# Patient Record
Sex: Male | Born: 1995 | Race: Black or African American | Hispanic: No | Marital: Single | Smoking: Never smoker
Health system: Southern US, Community
[De-identification: ages and names within clinical notes are randomized; demographics above are authoritative.]

---

## 2019-08-01 ENCOUNTER — Emergency Department (HOSPITAL_COMMUNITY)
Admission: EM | Admit: 2019-08-01 | Discharge: 2019-08-02 | Disposition: A | Discharge status: Home / Self Care | Payer: Medicaid Other | Attending: Emergency Medicine | Admitting: Emergency Medicine

## 2019-08-01 ENCOUNTER — Encounter (HOSPITAL_COMMUNITY): Payer: Self-pay

## 2019-08-01 DIAGNOSIS — Z23 Encounter for immunization: Secondary | ICD-10-CM | POA: Insufficient documentation

## 2019-08-01 DIAGNOSIS — S61211A Laceration without foreign body of left index finger without damage to nail, initial encounter: Secondary | ICD-10-CM | POA: Insufficient documentation

## 2019-08-01 DIAGNOSIS — Y939 Activity, unspecified: Secondary | ICD-10-CM | POA: Diagnosis not present

## 2019-08-01 DIAGNOSIS — W260XXA Contact with knife, initial encounter: Secondary | ICD-10-CM | POA: Diagnosis not present

## 2019-08-01 DIAGNOSIS — Z202 Contact with and (suspected) exposure to infections with a predominantly sexual mode of transmission: Secondary | ICD-10-CM

## 2019-08-01 DIAGNOSIS — Y929 Unspecified place or not applicable: Secondary | ICD-10-CM | POA: Diagnosis not present

## 2019-08-01 DIAGNOSIS — Y999 Unspecified external cause status: Secondary | ICD-10-CM | POA: Insufficient documentation

## 2019-08-01 NOTE — ED Triage Notes (Signed)
Pt states he cut his L index finger with a box cutter, bleeding controlled, last tetanus unknown.

## 2019-08-02 MED ORDER — TETANUS-DIPHTH-ACELL PERTUSSIS 5-2.5-18.5 LF-MCG/0.5 IM SUSP
0.5000 mL | Freq: Once | INTRAMUSCULAR | Status: AC
  Administered 2019-08-02: 01:00:00 0.5 mL via INTRAMUSCULAR
  Filled 2019-08-02: qty 0.5

## 2019-08-02 MED ORDER — STERILE WATER FOR INJECTION IJ SOLN
INTRAMUSCULAR | Status: AC
  Administered 2019-08-02: 01:00:00 2.1 mL
  Filled 2019-08-02: qty 10

## 2019-08-02 MED ORDER — AZITHROMYCIN 250 MG PO TABS
1000.0000 mg | ORAL_TABLET | Freq: Once | ORAL | Status: AC
  Administered 2019-08-02: 1000 mg via ORAL
  Filled 2019-08-02: qty 4

## 2019-08-02 MED ORDER — CEFTRIAXONE SODIUM 250 MG IJ SOLR
250.0000 mg | Freq: Once | INTRAMUSCULAR | Status: AC
  Administered 2019-08-02: 01:00:00 250 mg via INTRAMUSCULAR
  Filled 2019-08-02: qty 250

## 2019-08-02 MED ORDER — ONDANSETRON 4 MG PO TBDP
4.0000 mg | ORAL_TABLET | Freq: Once | ORAL | Status: AC
  Administered 2019-08-02: 4 mg via ORAL
  Filled 2019-08-02: qty 1

## 2019-08-02 NOTE — ED Notes (Signed)
Pt states he needs treatment for STI as well.

## 2019-08-02 NOTE — ED Provider Notes (Signed)
[facility] EMERGENCY DEPARTMENT Provider Note   CSN: [id number] Arrival date & time: 08/01/19  2201     History   Chief Complaint Chief Complaint  Patient presents with  . Finger Injury    HPI Jason Rodriguez is a 23 y.o. male.     Patient presents to the emergency department with a chief complaint of laceration.  He states that he cut his left index finger with a box cutter.  Last tetanus shot is unknown.  Bleeding controlled prior to arrival.  No numbness or weakness.  Also reports exposure to chlamydia.  Requesting treatment for this.  Denies any urinary symptoms.  The history is provided by the patient. No language interpreter was used.    History reviewed. No pertinent past medical history.  There are no active problems to display for this patient.   History reviewed. No pertinent surgical history.      Home Medications    Prior to Admission medications   Not on File    Family History No family history on file.  Social History Social History   Tobacco Use  . Smoking status: Not on file  Substance Use Topics  . Alcohol use: Not on file  . Drug use: Not on file     Allergies   Patient has no known allergies.   Review of Systems Review of Systems  All other systems reviewed and are negative.    Physical Exam Updated Vital Signs BP 131/67 (BP Location: Right Arm)   Pulse 69   Temp 98 F (36.7 C) (Oral)   Resp 14   SpO2 97%   Physical Exam Vitals signs and nursing note reviewed.  Constitutional:      General: He is not in acute distress.    Appearance: He is well-developed. He is not ill-appearing.  HENT:     Head: Normocephalic and atraumatic.  Eyes:     Conjunctiva/sclera: Conjunctivae normal.  Neck:     Musculoskeletal: Neck supple.  Cardiovascular:     Rate and Rhythm: Normal rate.  Pulmonary:     Effort: Pulmonary effort is normal. No respiratory distress.  Abdominal:     General: There is no distension.   Musculoskeletal:     Comments: Moves all extremities  Skin:    General: Skin is warm and dry.     Comments: 1 cm laceration to radial aspect of left distal index finger at the DIP, laceration is very shallow, nothing requiring repair with suture  Neurological:     Mental Status: He is alert and oriented to person, place, and time.  Psychiatric:        Mood and Affect: Mood normal.        Behavior: Behavior normal.      ED Treatments / Results  Labs (all labs ordered are listed, but only abnormal results are displayed) Labs Reviewed - No data to display  EKG None  Radiology No results found.  Procedures Procedures (including critical care time) LACERATION REPAIR Performed by: Montine Circle Authorized by: Montine Circle Consent: Verbal consent obtained. Risks and benefits: risks, benefits and alternatives were discussed Consent given by: patient Patient identity confirmed: provided demographic data Prepped and Draped in normal sterile fashion Wound explored  Laceration Location: left index finger  Laceration Length: 1cm  No Foreign Bodies seen or palpated  Irrigation method: syringe Amount of cleaning: standard  Skin closure: dermabond  Number of sutures: dermabond  Technique: dermabond  Patient tolerance: Patient tolerated the procedure  well with no immediate complications.   Medications Ordered in ED Medications  Tdap (BOOSTRIX) injection 0.5 mL (has no administration in time range)  cefTRIAXone (ROCEPHIN) injection 250 mg (has no administration in time range)  azithromycin (ZITHROMAX) tablet 1,000 mg (has no administration in time range)     Initial Impression / Assessment and Plan / ED Course  I have reviewed the triage vital signs and the nursing notes.  Pertinent labs & imaging results that were available during my care of the patient were reviewed by me and considered in my medical decision making (see chart for details).        Patient  with minor laceration left index finger.  Repaired with Dermabond.  No complicating features.  Also treated for chlamydia exposure.  Tetanus shot updated.  Final Clinical Impressions(s) / ED Diagnoses   Final diagnoses:  Laceration of left index finger without foreign body, nail damage status unspecified, initial encounter  STD exposure    ED Discharge Orders    None       Roxy Horseman, PA-C 08/02/19 0030    Dione Booze, MD 08/02/19 0120

## 2019-08-02 NOTE — ED Notes (Signed)
Patient verbalizes understanding of discharge instructions. Opportunity for questioning and answers were provided. Armband removed by staff, pt discharged from ED.  

## 2019-12-04 ENCOUNTER — Other Ambulatory Visit: Payer: Self-pay

## 2019-12-04 ENCOUNTER — Encounter (HOSPITAL_BASED_OUTPATIENT_CLINIC_OR_DEPARTMENT_OTHER): Payer: Self-pay

## 2019-12-04 ENCOUNTER — Emergency Department (HOSPITAL_BASED_OUTPATIENT_CLINIC_OR_DEPARTMENT_OTHER)
Admission: EM | Admit: 2019-12-04 | Discharge: 2019-12-04 | Disposition: A | Discharge status: Home / Self Care | Payer: Medicaid Other | Attending: Emergency Medicine | Admitting: Emergency Medicine

## 2019-12-04 DIAGNOSIS — B081 Molluscum contagiosum: Secondary | ICD-10-CM | POA: Diagnosis not present

## 2019-12-04 DIAGNOSIS — Z202 Contact with and (suspected) exposure to infections with a predominantly sexual mode of transmission: Secondary | ICD-10-CM | POA: Diagnosis present

## 2019-12-04 DIAGNOSIS — R300 Dysuria: Secondary | ICD-10-CM | POA: Diagnosis not present

## 2019-12-04 LAB — URINALYSIS, ROUTINE W REFLEX MICROSCOPIC
Bilirubin Urine: NEGATIVE
Glucose, UA: NEGATIVE mg/dL
Hgb urine dipstick: NEGATIVE
Ketones, ur: NEGATIVE mg/dL
Nitrite: NEGATIVE
Protein, ur: NEGATIVE mg/dL
Specific Gravity, Urine: 1.02 (ref 1.005–1.030)
pH: 6.5 (ref 5.0–8.0)

## 2019-12-04 LAB — URINALYSIS, MICROSCOPIC (REFLEX)

## 2019-12-04 MED ORDER — LIDOCAINE HCL (PF) 1 % IJ SOLN
INTRAMUSCULAR | Status: AC
  Administered 2019-12-04: 5 mL
  Filled 2019-12-04: qty 5

## 2019-12-04 MED ORDER — CEFTRIAXONE SODIUM 500 MG IJ SOLR
500.0000 mg | Freq: Once | INTRAMUSCULAR | Status: AC
  Administered 2019-12-04: 500 mg via INTRAMUSCULAR
  Filled 2019-12-04: qty 500

## 2019-12-04 MED ORDER — AZITHROMYCIN 250 MG PO TABS
1000.0000 mg | ORAL_TABLET | Freq: Once | ORAL | Status: AC
  Administered 2019-12-04: 1000 mg via ORAL
  Filled 2019-12-04: qty 4

## 2019-12-04 NOTE — ED Provider Notes (Signed)
[facility] EMERGENCY DEPARTMENT Provider Note   CSN: [id number] Arrival date & time: 12/04/19  [place]     History Chief Complaint  Patient presents with  . SEXUALLY TRANSMITTED DISEASE    Jason Rodriguez is a 24 y.o. male.  Patient presents to the emergency department today with complaint of genital lesions as well as dysuria.  Symptoms started after having unprotected sexual intercourse with a male partner.  Patient has noted several bumps in the groin area.  Over the past few days he has had dysuria but no urethral discharge.  No fevers.  No increased frequency or urgency.  Patient denies other complaints.        History reviewed. No pertinent past medical history.  There are no problems to display for this patient.   History reviewed. No pertinent surgical history.     No family history on file.  Social History   Tobacco Use  . Smoking status: Never Smoker  . Smokeless tobacco: Never Used  Substance Use Topics  . Alcohol use: Never  . Drug use: Never    Home Medications Prior to Admission medications   Not on File    Allergies    Patient has no known allergies.  Review of Systems   Review of Systems  Constitutional: Negative for fever.  HENT: Negative for sore throat.   Eyes: Negative for discharge.  Gastrointestinal: Negative for rectal pain.  Genitourinary: Positive for dysuria and genital sores. Negative for discharge, flank pain, frequency, hematuria, penile pain, penile swelling and testicular pain.  Musculoskeletal: Negative for arthralgias.  Skin: Negative for rash.  Hematological: Negative for adenopathy.    Physical Exam Updated Vital Signs BP 133/66 (BP Location: Left Arm)   Pulse 85   Temp 98.5 F (36.9 C) (Oral)   Resp 18   Ht 5\' 9"  (1.753 m)   Wt 72.6 kg   SpO2 100%   BMI 23.63 kg/m   Physical Exam Vitals and nursing note reviewed. Exam conducted with a chaperone present.  Constitutional:      Appearance: He is  well-developed.  HENT:     Head: Normocephalic and atraumatic.  Eyes:     Conjunctiva/sclera: Conjunctivae normal.  Pulmonary:     Effort: No respiratory distress.  Genitourinary:    Penis: Normal.      Testes: Normal.        Right: Mass, tenderness or swelling not present.        Left: Mass, tenderness or swelling not present.    Musculoskeletal:     Cervical back: Normal range of motion and neck supple.  Skin:    General: Skin is warm and dry.  Neurological:     Mental Status: He is alert.     ED Results / Procedures / Treatments   Labs (all labs ordered are listed, but only abnormal results are displayed) Labs Reviewed  URINALYSIS, ROUTINE W REFLEX MICROSCOPIC - Abnormal; Notable for the following components:      Result Value   Leukocytes,Ua TRACE (*)    All other components within normal limits  URINALYSIS, MICROSCOPIC (REFLEX) - Abnormal; Notable for the following components:   Bacteria, UA RARE (*)    All other components within normal limits  RPR  HIV ANTIBODY (ROUTINE TESTING W REFLEX)  GC/CHLAMYDIA PROBE AMP ([facility]) NOT AT [facility]    EKG None  Radiology No results found.  Procedures Procedures (including critical care time)  Medications Ordered in ED Medications  cefTRIAXone (ROCEPHIN) injection 500  mg (has no administration in time range)  azithromycin (ZITHROMAX) tablet 1,000 mg (has no administration in time range)  lidocaine (PF) (XYLOCAINE) 1 % injection (has no administration in time range)    ED Course  I have reviewed the triage vital signs and the nursing notes.  Pertinent labs & imaging results that were available during my care of the patient were reviewed by me and considered in my medical decision making (see chart for details).  Patient seen and examined.  UA and STD testing ordered.  Vital signs reviewed and are as follows: BP 133/66 (BP Location: Left Arm)   Pulse 85   Temp 98.5 F (36.9 C) (Oral)   Resp 18   Ht 5\' 9"   (1.753 m)   Wt 72.6 kg   SpO2 100%   BMI 23.63 kg/m   Patient does have white blood cells in the urine.  Due to suspicion for urethritis, will treat with Rocephin and azithromycin.  Patient states that he has had a lot of vomiting with doxycycline in the past and cannot tolerate this.  Discussed that he would need to follow-up with a dermatologist over concerns of persistent molluscum lesions.    MDM Rules/Calculators/A&P                      Genital lesions: Most consistent with molluscum contagiosum.  Doubt herpes simplex infection.  Dysuria: Concern for urethritis given history.  Patient tested and treated for GC and chlamydia.  HIV and RPR pending.   Final Clinical Impression(s) / ED Diagnoses Final diagnoses:  Molluscum contagiosum  Dysuria    Rx / DC Orders ED Discharge Orders    None       12/04/19 2036    2037, MD 12/04/19 [phone]

## 2019-12-04 NOTE — ED Notes (Signed)
ED Provider at bedside. 

## 2019-12-04 NOTE — ED Triage Notes (Signed)
Pt arrives to ED with c/o "bumps around private area" and concern for STD pt has been having burning with urination X2 weeks.

## 2019-12-04 NOTE — Discharge Instructions (Signed)
Please read and follow all provided instructions.  Your diagnoses today include:  1. Molluscum contagiosum   2. Dysuria     Tests performed today include:  Test for gonorrhea and chlamydia.   Test for HIV and syphilis.   You will be notified by telephone with any positive results.   Vital signs. See below for your results today.   Medications:  For treatment of gonorrhea: You were treated with a rocephin (shot) today.   For treatment of chlamydia: You received azithromycin tablets today.  Home care instructions:  Read educational materials contained in this packet and follow any instructions provided.   If you need to have the molluscum lesions treated, you should follow-up with a dermatologist.  Treatment for this requires topical medications prescribed by dermatologist.  You should tell your partners about your infection and avoid having sex for one week to allow time for the medicine to work.  Sexually transmitted disease testing also available at:   [facility] [place], [facility]  [address] [place], phone [phone] or [phone]    Monday - Friday, call for an appointment  Return instructions:   Please return to the Emergency Department if you experience worsening symptoms.   Please return if you have any other emergent concerns.  Additional Information:  Your vital signs today were: BP 133/66 (BP Location: Left Arm)    Pulse 85    Temp 98.5 F (36.9 C) (Oral)    Resp 18    Ht 5\' 9"  (1.753 m)    Wt 72.6 kg    SpO2 100%    BMI 23.63 kg/m  If your blood pressure (BP) was elevated above 135/85 this visit, please have this repeated by your doctor within one month. --------------

## 2019-12-05 LAB — HIV ANTIBODY (ROUTINE TESTING W REFLEX): HIV Screen 4th Generation wRfx: NONREACTIVE

## 2019-12-05 LAB — RPR: RPR Ser Ql: NONREACTIVE

## 2020-02-23 ENCOUNTER — Emergency Department (HOSPITAL_BASED_OUTPATIENT_CLINIC_OR_DEPARTMENT_OTHER)
Admission: EM | Admit: 2020-02-23 | Discharge: 2020-02-23 | Disposition: A | Discharge status: Home / Self Care | Payer: Medicaid Other | Attending: Emergency Medicine | Admitting: Emergency Medicine

## 2020-02-23 ENCOUNTER — Encounter (HOSPITAL_BASED_OUTPATIENT_CLINIC_OR_DEPARTMENT_OTHER): Payer: Self-pay | Admitting: Emergency Medicine

## 2020-02-23 ENCOUNTER — Other Ambulatory Visit: Payer: Self-pay

## 2020-02-23 ENCOUNTER — Emergency Department (HOSPITAL_BASED_OUTPATIENT_CLINIC_OR_DEPARTMENT_OTHER): Payer: Medicaid Other

## 2020-02-23 DIAGNOSIS — M545 Low back pain, unspecified: Secondary | ICD-10-CM

## 2020-02-23 DIAGNOSIS — Y999 Unspecified external cause status: Secondary | ICD-10-CM | POA: Insufficient documentation

## 2020-02-23 DIAGNOSIS — Y92410 Unspecified street and highway as the place of occurrence of the external cause: Secondary | ICD-10-CM | POA: Diagnosis not present

## 2020-02-23 DIAGNOSIS — R519 Headache, unspecified: Secondary | ICD-10-CM | POA: Diagnosis not present

## 2020-02-23 DIAGNOSIS — Y9389 Activity, other specified: Secondary | ICD-10-CM | POA: Diagnosis not present

## 2020-02-23 DIAGNOSIS — S0990XA Unspecified injury of head, initial encounter: Secondary | ICD-10-CM | POA: Diagnosis present

## 2020-02-23 MED ORDER — ACETAMINOPHEN 325 MG PO TABS
650.0000 mg | ORAL_TABLET | Freq: Once | ORAL | Status: AC
  Administered 2020-02-23: 650 mg via ORAL
  Filled 2020-02-23: qty 2

## 2020-02-23 MED ORDER — METHOCARBAMOL 500 MG PO TABS: 500.0000 mg | ORAL_TABLET | Freq: Two times a day (BID) | ORAL | 0 refills | Status: AC

## 2020-02-23 MED ORDER — NAPROXEN 500 MG PO TABS: 500.0000 mg | ORAL_TABLET | Freq: Two times a day (BID) | ORAL | 0 refills | Status: AC

## 2020-02-23 MED FILL — NAPROXEN 500 MG TABS: 500 | 15 days supply | Qty: 30 | Fill #0

## 2020-02-23 MED FILL — METHOCARBAMOL 500 MG TABS: 500 | 10 days supply | Qty: 20 | Fill #0
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
## 2020-02-23 NOTE — ED Notes (Signed)
Pt returned from CT °
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
## 2021-01-04 ENCOUNTER — Emergency Department (HOSPITAL_BASED_OUTPATIENT_CLINIC_OR_DEPARTMENT_OTHER): Payer: 59

## 2021-01-04 ENCOUNTER — Encounter (HOSPITAL_BASED_OUTPATIENT_CLINIC_OR_DEPARTMENT_OTHER): Payer: Self-pay | Admitting: *Deleted

## 2021-01-04 ENCOUNTER — Other Ambulatory Visit: Payer: Self-pay

## 2021-01-04 ENCOUNTER — Emergency Department (HOSPITAL_BASED_OUTPATIENT_CLINIC_OR_DEPARTMENT_OTHER)
Admission: EM | Admit: 2021-01-04 | Discharge: 2021-01-04 | Disposition: A | Discharge status: Home / Self Care | Payer: 59 | Attending: Emergency Medicine | Admitting: Emergency Medicine

## 2021-01-04 DIAGNOSIS — Z28310 Unvaccinated for covid-19: Secondary | ICD-10-CM | POA: Diagnosis not present

## 2021-01-04 DIAGNOSIS — R079 Chest pain, unspecified: Secondary | ICD-10-CM

## 2021-01-04 DIAGNOSIS — J3489 Other specified disorders of nose and nasal sinuses: Secondary | ICD-10-CM | POA: Diagnosis not present

## 2021-01-04 DIAGNOSIS — Z20822 Contact with and (suspected) exposure to covid-19: Secondary | ICD-10-CM | POA: Insufficient documentation

## 2021-01-04 DIAGNOSIS — J069 Acute upper respiratory infection, unspecified: Secondary | ICD-10-CM

## 2021-01-04 DIAGNOSIS — R0981 Nasal congestion: Secondary | ICD-10-CM | POA: Diagnosis present

## 2021-01-04 DIAGNOSIS — R0602 Shortness of breath: Secondary | ICD-10-CM | POA: Insufficient documentation

## 2021-01-04 LAB — RESP PANEL BY RT-PCR (FLU A&B, COVID) ARPGX2
Influenza A by PCR: NEGATIVE
Influenza B by PCR: NEGATIVE
SARS Coronavirus 2 by RT PCR: NEGATIVE

## 2021-01-04 MED ORDER — BENZONATATE 100 MG PO CAPS: 100.0000 mg | ORAL_CAPSULE | Freq: Three times a day (TID) | ORAL | 0 refills | Status: DC

## 2021-01-04 MED ORDER — ALBUTEROL SULFATE HFA 108 (90 BASE) MCG/ACT IN AERS
1.0000 | INHALATION_SPRAY | Freq: Once | RESPIRATORY_TRACT | Status: AC
  Administered 2021-01-04: 2 via RESPIRATORY_TRACT
  Filled 2021-01-04: qty 6.7

## 2021-01-04 NOTE — ED Provider Notes (Signed)
[facility] EMERGENCY DEPARTMENT Provider Note   CSN: [id number] Arrival date & time: 01/04/21  1557     History Chief Complaint  Patient presents with  . URI    Jason Rodriguez is a 25 y.o. male who presents for evaluation of of nasal congestion, rhinorrhea, cough that has been ongoing for the last 2 days.  He reports that he has also had subjective fevers but has not measured anything.  He reports cough is productive of phlegm.  He feels like at times, he is wheezing it makes him short of breath.  No history of asthma.  He does not smoke any cigarettes.  He started using Flonase for the nasal congestion reports improvement.  He has not been vaccinated for COVID.  No Covid exposure that he knows of.  The history is provided by the patient.       History reviewed. No pertinent past medical history.  There are no problems to display for this patient.   History reviewed. No pertinent surgical history.     History reviewed. No pertinent family history.  Social History   Tobacco Use  . Smoking status: Never Smoker  . Smokeless tobacco: Never Used  Substance Use Topics  . Alcohol use: Never  . Drug use: Yes    Types: Marijuana    Comment: 2-3 x per day    Home Medications Prior to Admission medications   Medication Sig Start Date End Date Taking? Authorizing Provider  benzonatate (TESSALON) 100 MG capsule Take 1 capsule (100 mg total) by mouth every 8 (eight) hours. 01/04/21  Yes Maxwell Caul, PA-C  methocarbamol (ROBAXIN) 500 MG tablet Take 1 tablet (500 mg total) by mouth 2 (two) times daily. 02/23/20   Mannie Stabile, PA-C  naproxen (NAPROSYN) 500 MG tablet Take 1 tablet (500 mg total) by mouth 2 (two) times daily. 02/23/20   Mannie Stabile, PA-C    Allergies    Patient has no known allergies.  Review of Systems   Review of Systems  Constitutional: Positive for fever.  HENT: Positive for congestion and rhinorrhea.   Respiratory: Positive for  cough and wheezing.   Gastrointestinal: Negative for vomiting.  All other systems reviewed and are negative.   Physical Exam Updated Vital Signs BP 132/81 (BP Location: Right Arm)   Pulse 87   Temp 98 F (36.7 C) (Oral)   Resp 18   Ht 5\' 9"  (1.753 m)   Wt 78.5 kg   SpO2 98%   BMI 25.55 kg/m   Physical Exam Vitals and nursing note reviewed.  Constitutional:      Appearance: He is well-developed.     Comments: Well appearing, intermittently coughing   HENT:     Head: Normocephalic and atraumatic.  Eyes:     General: No scleral icterus.       Right eye: No discharge.        Left eye: No discharge.     Conjunctiva/sclera: Conjunctivae normal.  Pulmonary:     Effort: Pulmonary effort is normal.     Comments: Intermittently coughing.  No evidence of respiratory distress.  No wheezing noted to the lungs.  No crackles, rales noted. Skin:    General: Skin is warm and dry.  Neurological:     Mental Status: He is alert.  Psychiatric:        Speech: Speech normal.        Behavior: Behavior normal.     ED Results / Procedures /  Treatments   Labs (all labs ordered are listed, but only abnormal results are displayed) Labs Reviewed  RESP PANEL BY RT-PCR (FLU A&B, COVID) ARPGX2    EKG None  Radiology DG Chest [facility] 1 View  Result Date: 01/04/2021 CLINICAL DATA:  Cough, congestion EXAM: PORTABLE CHEST 1 VIEW COMPARISON:  None. FINDINGS: The heart size and mediastinal contours are within normal limits. Both lungs are clear. The visualized skeletal structures are unremarkable. IMPRESSION: No active disease. Electronically Signed   By: Charlett Nose M.D.   On: 01/04/2021 17:39    Procedures Procedures   Medications Ordered in ED Medications  albuterol (VENTOLIN HFA) 108 (90 Base) MCG/ACT inhaler 1-2 puff (2 puffs Inhalation Given 01/04/21 1826)    ED Course  I have reviewed the triage vital signs and the nursing notes.  Pertinent labs & imaging results that were available  during my care of the patient were reviewed by me and considered in my medical decision making (see chart for details).    MDM Rules/Calculators/A&P                           25 year old male who presents for evaluation of cough, nasal congestion, rhinorrhea, subjective fevers that have been ongoing for last 2 days.  States he used Flonase which help with nasal congestion but still is having cough.  At times, he feels like he is wheezing.  Initial arrival, he is afebrile, toxic appearing.  Vital signs are stable.  He reports that he has not been vaccinated for COVID.  No Covid exposure that he knows of.  We will plan for chest x-ray, Covid test.  Chest x-ray negative for any acute effects etiology.  Covid test, flu are negative.  Discussed results with patient.  Patient feels like he is wheezing.  On my evaluation, it sounds more like transmitted upper airway sounds.  He is in no respiratory distress and vitals are stable.  Will give him albuterol inhaler for symptomatic treatment.  Suspect this is likely viral URI.  At this time, no need for antibiotics.  Will treat symptomatically.  Patient encouraged on at home supportive care measures. At this time, patient exhibits no emergent life-threatening condition that require further evaluation in ED. Patient had ample opportunity for questions and discussion. All patient's questions were answered with full understanding. Strict return precautions discussed. Patient expresses understanding and agreement to plan.   Portions of this note were generated with Scientist, clinical (histocompatibility and immunogenetics). Dictation errors may occur despite best attempts at proofreading.  Final Clinical Impression(s) / ED Diagnoses Final diagnoses:  Viral URI with cough    Rx / DC Orders ED Discharge Orders         Ordered    benzonatate (TESSALON) 100 MG capsule  Every 8 hours        01/04/21 1849           Rosana Hoes 01/04/21 1911    Koleen Distance, MD 01/04/21  2209

## 2021-01-04 NOTE — ED Provider Notes (Signed)
MSE was initiated and I personally evaluated the patient and placed orders (if any) at  4:03 PM on January 04, 2021.   Patient placed in Quick Look pathway, seen and evaluated   Chief Complaint: cough, rhinorrhea, congestion   HPI:   25 y.o. M who presents for evaluation of nasal congestion, rhinorrhea, cough that has been ongoing for 2 days.   ROS: cough, congestion   Physical Exam:   Gen: No distress  Neuro: Awake and Alert  Skin: Warm    Focused Exam: RRR, CTAB   Initiation of care has begun. The patient has been counseled on the process, plan, and necessity for staying for the completion/evaluation, and the remainder of the medical screening examination\  Portions of this note were generated with Dragon dictation software. Dictation errors may occur despite best attempts at proofreading.     Maxwell Caul, PA-C 01/04/21 1911    Koleen Distance, MD 01/04/21 [phone]

## 2021-01-04 NOTE — ED Notes (Signed)
Pt describes coughing x 2 days.  Thin sputum yellowish in color.  Does admitted to smoking marijuana 2 -3 x daily

## 2021-01-04 NOTE — ED Triage Notes (Signed)
C/o cough and congestion x 2 days

## 2021-01-04 NOTE — Discharge Instructions (Signed)
Continue using Flonase for nasal congestion.  Take Tessalon Perles for cough.  Use inhaler for wheezing, coughing fits.  Return the emergency department any worsening symptoms, difficulty breathing, persistent fever despite Tylenol or ibuprofen or any other worsening concerning symptoms.

## 2022-10-17 ENCOUNTER — Encounter (HOSPITAL_BASED_OUTPATIENT_CLINIC_OR_DEPARTMENT_OTHER): Payer: Self-pay | Admitting: Emergency Medicine

## 2022-10-17 ENCOUNTER — Other Ambulatory Visit: Payer: Self-pay

## 2022-10-17 ENCOUNTER — Emergency Department (HOSPITAL_BASED_OUTPATIENT_CLINIC_OR_DEPARTMENT_OTHER)
Admission: EM | Admit: 2022-10-17 | Discharge: 2022-10-17 | Disposition: A | Discharge status: Home / Self Care | Payer: Medicaid Other | Attending: Emergency Medicine | Admitting: Emergency Medicine

## 2022-10-17 DIAGNOSIS — J069 Acute upper respiratory infection, unspecified: Secondary | ICD-10-CM | POA: Diagnosis not present

## 2022-10-17 DIAGNOSIS — M7918 Myalgia, other site: Secondary | ICD-10-CM | POA: Insufficient documentation

## 2022-10-17 DIAGNOSIS — R059 Cough, unspecified: Secondary | ICD-10-CM | POA: Diagnosis present

## 2022-10-17 DIAGNOSIS — Z20822 Contact with and (suspected) exposure to covid-19: Secondary | ICD-10-CM | POA: Diagnosis not present

## 2022-10-17 LAB — RESP PANEL BY RT-PCR (RSV, FLU A&B, COVID)  RVPGX2
Influenza A by PCR: NEGATIVE
Influenza B by PCR: NEGATIVE
Resp Syncytial Virus by PCR: NEGATIVE
SARS Coronavirus 2 by RT PCR: NEGATIVE

## 2022-10-17 MED ORDER — BENZONATATE 100 MG PO CAPS: 100.0000 mg | ORAL_CAPSULE | Freq: Three times a day (TID) | ORAL | 0 refills | Status: AC | PRN

## 2022-10-17 NOTE — Discharge Instructions (Addendum)
Note the workup today was overall reassuring.  As discussed, you tested negative for COVID, RSV, influenza.  Recommend symptomatic therapy at home with Tylenol/Motrin as needed for pain/fever, nasal steroid spray in the form of Nasacort/Flonase, daily allergy medicine in the form of Claritin/Allegra/Zyrtec as well as cough suppressant to use as needed.  Recommend follow-up with primary care for reassessment of your symptoms.  Work note attached to the back.  Please do not hesitate to return to emergency department for worrisome signs and symptoms we discussed become apparent.

## 2022-10-17 NOTE — ED Triage Notes (Signed)
Pt arrives pov, c/o cough with congestion and body aches x 3 days. Needs work note

## 2022-10-17 NOTE — ED Provider Notes (Signed)
[facility] EMERGENCY DEPARTMENT Provider Note   CSN: [id number] Arrival date & time: 10/17/22  4098     History  Chief Complaint  Patient presents with   Cough    Jason Rodriguez is a 27 y.o. male.   Cough   27 year old male presents emergency department complaints of cough, nasal congestion, diffuse myalgias/arthralgias, and mildly sore throat.  Patient reports history of symptoms beginning approximately 3 days ago.  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  Has taken at home DayQuil which has helped some.  Denies fever, chills, night sweats, chest pain, shortness of breath, abdominal pain, nausea, vomiting.  No significant pertinent past medical history.  Home Medications Prior to Admission medications   Medication Sig Start Date End Date Taking? Authorizing Provider  benzonatate (TESSALON) 100 MG capsule Take 1 capsule (100 mg total) by mouth 3 (three) times daily as needed for cough. 10/17/22  Yes Dion Saucier A, PA  methocarbamol (ROBAXIN) 500 MG tablet Take 1 tablet (500 mg total) by mouth 2 (two) times daily. 02/23/20   Suzy Bouchard, PA-C  naproxen (NAPROSYN) 500 MG tablet Take 1 tablet (500 mg total) by mouth 2 (two) times daily. 02/23/20   Suzy Bouchard, PA-C      Allergies    Patient has no known allergies.    Review of Systems   Review of Systems  Respiratory:  Positive for cough.   All other systems reviewed and are negative.   Physical Exam Updated Vital Signs BP 121/81 (BP Location: Right Arm)   Pulse 80   Temp 98.2 F (36.8 C) (Oral)   Resp 18   Wt 81.6 kg   SpO2 97%   BMI 26.58 kg/m  Physical Exam Vitals and nursing note reviewed.  Constitutional:      General: He is not in acute distress.    Appearance: He is well-developed.  HENT:     Head: Normocephalic and atraumatic.     Nose: Congestion and  rhinorrhea present.     Mouth/Throat:     Pharynx: Posterior oropharyngeal erythema present.     Comments: Mild posterior pharyngeal erythema.  Uvula midline rise symmetrical phonation.  Tonsils 1+ bilaterally with no obvious exudate.  No sublingual or submandibular swelling appreciated. Eyes:     Conjunctiva/sclera: Conjunctivae normal.  Cardiovascular:     Rate and Rhythm: Normal rate and regular rhythm.     Heart sounds: No murmur heard. Pulmonary:     Effort: Pulmonary effort is normal. No respiratory distress.     Breath sounds: Normal breath sounds. No wheezing, rhonchi or rales.  Abdominal:     Palpations: Abdomen is soft.     Tenderness: There is no abdominal tenderness. There is no guarding.  Musculoskeletal:        General: No swelling.     Cervical back: Neck supple.  Skin:    General: Skin is warm and dry.     Capillary Refill: Capillary refill takes less than 2 seconds.  Neurological:     Mental Status: He is alert.  Psychiatric:        Mood and Affect: Mood normal.     ED Results / Procedures / Treatments   Labs (all labs ordered are listed, but only abnormal results are displayed) Labs Reviewed  RESP PANEL BY RT-PCR (RSV, FLU A&B, COVID)  RVPGX2    EKG  None  Radiology No results found.  Procedures Procedures    Medications Ordered in ED Medications - No data to display  ED Course/ Medical Decision Making/ A&P                             Medical Decision Making Risk Prescription drug management.   This patient presents to the ED for concern of flulike illness, this involves an extensive number of treatment options, and is a complaint that carries with it a high risk of complications and morbidity.  The differential diagnosis includes influenza, COVID, RSV, pneumonia, meningitis, sepsis   Co morbidities that complicate the patient evaluation  See HPI   Additional history obtained:  Additional history obtained from EMR External records  from outside source obtained and reviewed including hospital records   Lab Tests:  I Ordered, and personally interpreted labs.  The pertinent results include: Negative respiratory viral panel   Imaging Studies ordered:  N/a   Cardiac Monitoring: / EKG:  The patient was maintained on a cardiac monitor.  I personally viewed and interpreted the cardiac monitored which showed an underlying rhythm of: Sinus rhythm   Consultations Obtained:  N/a   Problem List / ED Course / Critical interventions / Medication management  Viral URI with cough Reevaluation of the patient showed that the patient stayed the same I have reviewed the patients home medicines and have made adjustments as needed   Social Determinants of Health:  Denies tobacco, illicit drug use   Test / Admission - Considered:  Viral URI with cough Vitals signs within normal range and stable throughout visit. Laboratory studies significant for: See above Patient overall well-appearing, afebrile in no acute respiratory distress.  Patient is negative for COVID, influenza, RSV but current pathology seems more viral in nature.  No clinical evidence of pneumonia so further imaging studies via chest x-ray was not ordered.  Recommend symptomatic therapy outpatient with daily antihistamine, nasal steroid spray, Tylenol/Motrin as needed for body aches/fever as well as cough suppressant to use as needed.  Patient recommended follow-up with primary care for reassessment of symptoms.  Treatment plan discussed at length with patient he acknowledged understanding was agreeable to said plan. Worrisome signs and symptoms were discussed with the patient, and the patient acknowledged understanding to return to the ED if noticed. Patient was stable upon discharge.          Final Clinical Impression(s) / ED Diagnoses Final diagnoses:  Viral URI with cough    Rx / DC Orders ED Discharge Orders          Ordered    benzonatate  (TESSALON) 100 MG capsule  3 times daily PRN        10/17/22 1035              Wilnette Kales, [place] 10/17/22 [place], [place], DO 10/17/22 1347

## 2022-10-17 NOTE — ED Notes (Signed)
Pt discharged to home. Discharge instructions have been discussed with patient and/or family members. Pt verbally acknowledges understanding d/c instructions, and endorses comprehension to checkout at registration before leaving.  °

## 2022-11-25 IMAGING — DX DG CHEST 1V PORT
1 series · 1 of 1 positions shown · non-contrast
Comparison: None.

CLINICAL DATA: Cough, congestion

EXAM:
PORTABLE CHEST 1 VIEW

[chest ap]
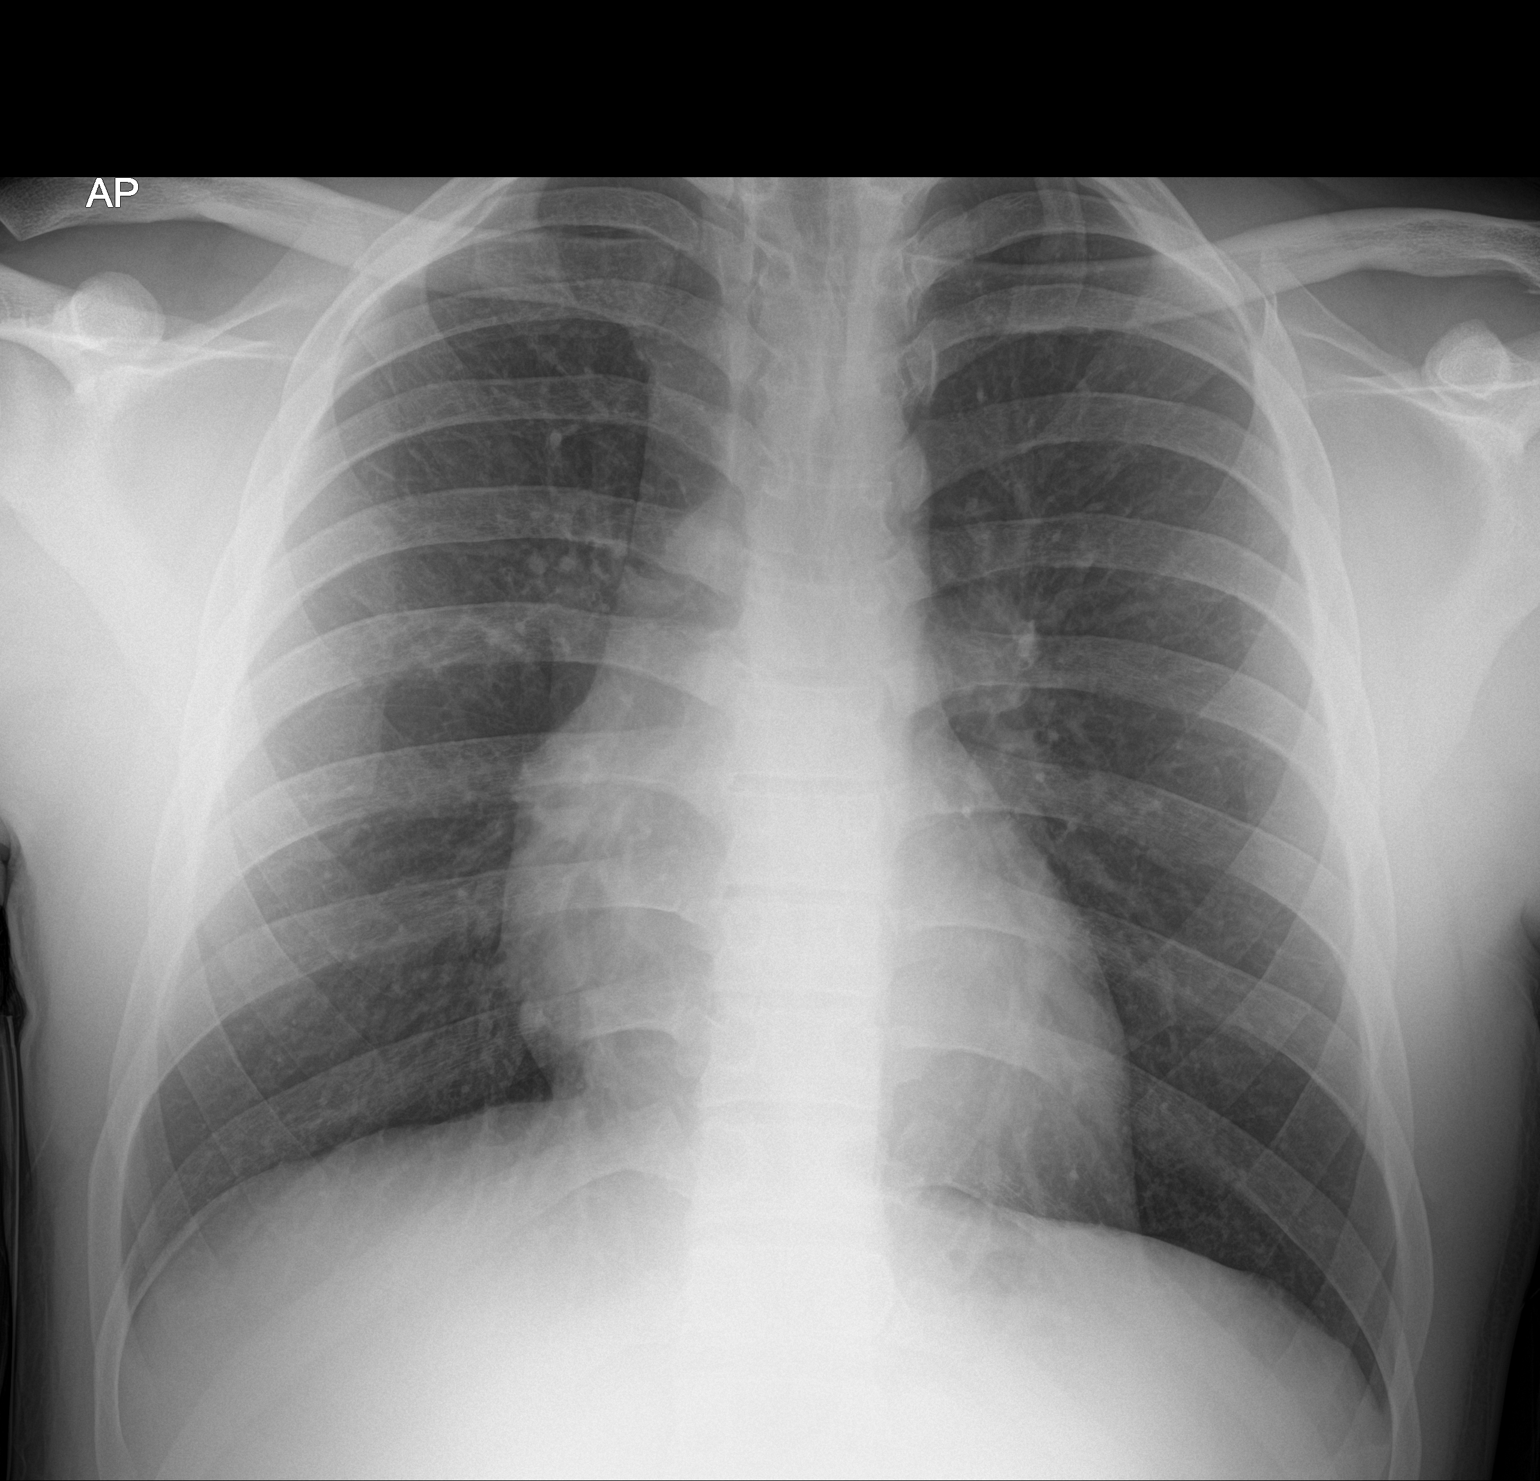

[1 of 1 positions shown; findings below may reference images not displayed]

FINDINGS: The heart size and mediastinal contours are within normal limits.
Both lungs are clear. The visualized skeletal structures are
unremarkable.
IMPRESSION: No active disease.

## 2024-09-17 ENCOUNTER — Emergency Department

## 2024-09-17 ENCOUNTER — Emergency Department
Admission: EM | Admit: 2024-09-17 | Discharge: 2024-09-17 | Disposition: A | Discharge status: Home / Self Care | Attending: Emergency Medicine | Admitting: Emergency Medicine

## 2024-09-17 ENCOUNTER — Other Ambulatory Visit: Payer: Self-pay

## 2024-09-17 DIAGNOSIS — Y990 Civilian activity done for income or pay: Secondary | ICD-10-CM | POA: Diagnosis not present

## 2024-09-17 DIAGNOSIS — S61216A Laceration without foreign body of right little finger without damage to nail, initial encounter: Secondary | ICD-10-CM | POA: Diagnosis not present

## 2024-09-17 DIAGNOSIS — S6991XA Unspecified injury of right wrist, hand and finger(s), initial encounter: Secondary | ICD-10-CM | POA: Diagnosis present

## 2024-09-17 DIAGNOSIS — W208XXA Other cause of strike by thrown, projected or falling object, initial encounter: Secondary | ICD-10-CM | POA: Diagnosis not present

## 2024-09-17 MED ORDER — CEPHALEXIN 500 MG PO CAPS: 500.0000 mg | ORAL_CAPSULE | Freq: Three times a day (TID) | ORAL | 0 refills | Status: AC

## 2024-09-17 MED ORDER — LIDOCAINE HCL (PF) 1 % IJ SOLN
5.0000 mL | Freq: Once | INTRAMUSCULAR | Status: AC
  Administered 2024-09-17: 5 mL via INTRADERMAL
  Filled 2024-09-17: qty 5

## 2024-09-17 NOTE — ED Triage Notes (Signed)
 Pt dropped a toilet on his R pinky finger. Pt says his finger burst. Pt presents with pressure dressing in place.

## 2024-09-17 NOTE — ED Notes (Signed)
 Dressing applied.

## 2024-09-17 NOTE — ED Provider Notes (Signed)
 "  [facility] Provider Note    None    (approximate)   History   Finger Injury   HPI  Jason Rodriguez is a 28 y.o. male with no significant past medical history presents emergency department with laceration to the right fifth finger.  Patient was at work, he and his coworker were lifting a unused toilet off of a shelf to put in an apartment.  When they dropped it and his finger was stuck in between the toilet and the shelf.  States he wrapped it up and then came to the ED.  Happened around 11 AM this morning.  Patient is right-handed.  Tdap is up-to-date      Physical Exam   Triage Vital Signs: ED Triage Vitals  Encounter Vitals Group     BP 09/17/24 1746 (!) 141/79     Girls Systolic BP Percentile --      Girls Diastolic BP Percentile --      Boys Systolic BP Percentile --      Boys Diastolic BP Percentile --      Pulse Rate 09/17/24 1746 90     Resp 09/17/24 1746 16     Temp 09/17/24 1745 98.3 F (36.8 C)     Temp Source 09/17/24 1745 Oral     SpO2 09/17/24 1746 99 %     Weight 09/17/24 1745 190 lb (86.2 kg)     Height 09/17/24 1745 5' 9 (1.753 m)     Head Circumference --      Peak Flow --      Pain Score --      Pain Loc --      Pain Education --      Exclude from Growth Chart --     Most recent vital signs: Vitals:   09/17/24 1746 09/17/24 2240  BP: (!) 141/79 105/67  Pulse: 90 70  Resp: 16 18  Temp:  98.3 F (36.8 C)  SpO2: 99% 98%     General: Awake, no distress.   CV:  Good peripheral perfusion.  Resp:  Normal effort.  Abd:  No distention.   Other:  Right fifth finger with laceration at the distal portion, no foreign body noted in the wound, foreign bodies were noted in the dressing when I removed it   ED Results / Procedures / Treatments   Labs (all labs ordered are listed, but only abnormal results are displayed) Labs Reviewed - No data to display   EKG     RADIOLOGY X-ray of the right fifth  finger    PROCEDURES:   .Laceration Repair  Date/Time: 09/17/2024 10:58 PM  Performed by: Gasper Devere ORN, PA-C Authorized by: Gasper Devere ORN, PA-C   Consent:    Consent obtained:  Verbal   Consent given by:  Patient   Risks, benefits, and alternatives were discussed: yes     Risks discussed:  Infection, pain, retained foreign body, tendon damage, poor cosmetic result, need for additional repair, nerve damage, poor wound healing and vascular damage   Alternatives discussed:  No treatment Universal protocol:    Procedure explained and questions answered to patient or proxy's satisfaction: yes     Immediately prior to procedure, a time out was called: yes     Patient identity confirmed:  Verbally with patient Anesthesia:    Anesthesia method:  Nerve block   Block location:  Right fifth finger   Block needle gauge:  27 G   Block anesthetic:  Lidocaine  1% w/o epi   Block technique:  Digital   Block injection procedure:  Anatomic landmarks identified, introduced needle, incremental injection, anatomic landmarks palpated and negative aspiration for blood   Block outcome:  Anesthesia achieved Laceration details:    Location:  Finger   Finger location:  R small finger   Length (cm):  2 Pre-procedure details:    Preparation:  Patient was prepped and draped in usual sterile fashion and imaging obtained to evaluate for foreign bodies Exploration:    Limited defect created (wound extended): no     Hemostasis achieved with:  Direct pressure   Imaging obtained: x-ray     Imaging outcome: foreign body noted     Wound exploration: wound explored through full range of motion and entire depth of wound visualized     Wound extent: areolar tissue not violated, fascia not violated, no foreign body, no signs of injury, no nerve damage, no tendon damage, no underlying fracture and no vascular damage     Contaminated: no   Treatment:    Area cleansed with:  Povidone-iodine and saline   Amount  of cleaning:  Standard   Irrigation solution:  Sterile saline   Irrigation method:  Syringe and tap   Debridement:  None   Undermining:  None   Scar revision: no   Skin repair:    Repair method:  Sutures   Suture size:  5-0   Suture material:  Nylon   Suture technique:  Simple interrupted   Number of sutures:  7 Approximation:    Approximation:  Close Repair type:    Repair type:  Simple Post-procedure details:    Dressing:  Non-adherent dressing   Procedure completion:  Tolerated well, no immediate complications Comments:     Foreign bodies that were noted on x-ray were actually in the dressing and not in the wound   Critical Care:  no Chief Complaint  Patient presents with   Finger Injury      MEDICATIONS ORDERED IN ED: Medications  lidocaine  (PF) (XYLOCAINE ) 1 % injection 5 mL (has no administration in time range)     IMPRESSION / MDM / ASSESSMENT AND PLAN / ED COURSE  I reviewed the triage vital signs and the nursing notes.                              Differential diagnosis includes, but is not limited to, fracture, contusion, laceration  Patient's presentation is most consistent with acute illness / injury with system symptoms.   See procedure note for laceration repair  I did explain all findings to the patient.  He tolerated procedure well.  He is to follow-up with urgent care in 1 week for suture removal.  I did place him on a antibiotic as the area had been open for several hours today.  Do have concerns he could have gotten it contaminated.  He is in agreement with treatment plan.  Discharged stable condition.       FINAL CLINICAL IMPRESSION(S) / ED DIAGNOSES   Final diagnoses:  Laceration of right little finger without foreign body without damage to nail, initial encounter     Rx / DC Orders   ED Discharge Orders          Ordered    cephALEXin  (KEFLEX ) 500 MG capsule  3 times daily        09/17/24 2226  Note:  This  document was prepared using Dragon voice recognition software and may include unintentional dictation errors.    Gasper Devere ORN, PA-C 09/17/24 2300    Dicky Anes, MD 09/27/24 2049  "

## 2024-09-29 ENCOUNTER — Ambulatory Visit
Admission: EM | Admit: 2024-09-29 | Discharge: 2024-09-29 | Disposition: A | Discharge status: Home / Self Care | Attending: Emergency Medicine | Admitting: Emergency Medicine

## 2024-09-29 DIAGNOSIS — Z4802 Encounter for removal of sutures: Secondary | ICD-10-CM | POA: Diagnosis not present

## 2024-09-29 DIAGNOSIS — S61216D Laceration without foreign body of right little finger without damage to nail, subsequent encounter: Secondary | ICD-10-CM | POA: Diagnosis not present

## 2024-09-29 NOTE — Discharge Instructions (Addendum)
 See the attached information on wound care after stitches have been removed.  Follow-up with your primary care provider as needed.

## 2024-09-29 NOTE — ED Triage Notes (Addendum)
 Patient to urgent care for suture removal.   Patient had 7 stitches placed to R little finger 12/19. Wound appears well-healed. No drainage or redness.

## 2024-09-29 NOTE — ED Provider Notes (Signed)
 " Jason Rodriguez    CSN: [id number] Arrival date & time: 09/29/24  9147      History   Chief Complaint Chief Complaint  Patient presents with   Suture / Staple Removal    HPI Jason Rodriguez is a 28 y.o. male.  Patient presents for suture removal.  He was seen at [facility] ED on 09/17/2024 for laceration of the right little finger; 7 sutures placed.  He reports no fever, redness, purulent drainage.  The history is provided by the patient and medical records.    History reviewed. No pertinent past medical history.  There are no active problems to display for this patient.   History reviewed. No pertinent surgical history.     Home Medications    Prior to Admission medications  Medication Sig Start Date End Date Taking? Authorizing Provider  benzonatate  (TESSALON ) 100 MG capsule Take 1 capsule (100 mg total) by mouth 3 (three) times daily as needed for cough. Patient not taking: Reported on 09/29/2024 10/17/22   Silver Wonda LABOR, PA  methocarbamol  (ROBAXIN ) 500 MG tablet Take 1 tablet (500 mg total) by mouth 2 (two) times daily. Patient not taking: Reported on 09/29/2024 02/23/20   Aberman, Caroline C, PA-C  naproxen  (NAPROSYN ) 500 MG tablet Take 1 tablet (500 mg total) by mouth 2 (two) times daily. Patient not taking: Reported on 09/29/2024 02/23/20   Jason Rodriguez    Family History History reviewed. No pertinent family history.  Social History Social History[1]   Allergies   Patient has no known allergies.   Review of Systems Review of Systems  Constitutional:  Negative for chills and fever.  Skin:  Positive for wound. Negative for color change.  Neurological:  Negative for weakness and numbness.     Physical Exam Triage Vital Signs ED Triage Vitals  Encounter Vitals Group     BP 09/29/24 1020 (!) 117/54     Girls Systolic BP Percentile --      Girls Diastolic BP Percentile --      Boys Systolic BP Percentile --      Boys Diastolic BP  Percentile --      Pulse Rate 09/29/24 1018 78     Resp 09/29/24 1018 16     Temp 09/29/24 1018 98 F (36.7 C)     Temp src --      SpO2 09/29/24 1018 98 %     Weight --      Height --      Head Circumference --      Peak Flow --      Pain Score 09/29/24 1018 1     Pain Loc --      Pain Education --      Exclude from Growth Chart --    No data found.  Updated Vital Signs BP (!) 117/54   Pulse 78   Temp 98 F (36.7 C)   Resp 16   SpO2 98%   Visual Acuity Right Eye Distance:   Left Eye Distance:   Bilateral Distance:    Right Eye Near:   Left Eye Near:    Bilateral Near:     Physical Exam Constitutional:      General: He is not in acute distress. HENT:     Mouth/Throat:     Mouth: Mucous membranes are moist.  Cardiovascular:     Rate and Rhythm: Normal rate.  Pulmonary:     Effort: Pulmonary effort is normal. No respiratory distress.  Skin:    General: Skin is warm and dry.     Findings: Lesion present. No erythema.     Comments: Right little finger: Healing laceration; no erythema or drainage.  Neurological:     Mental Status: He is alert.      UC Treatments / Results  Labs (all labs ordered are listed, but only abnormal results are displayed) Labs Reviewed - No data to display  EKG   Radiology No results found.  Procedures Procedures (including critical care time)  Medications Ordered in UC Medications - No data to display  Initial Impression / Assessment and Plan / UC Course  I have reviewed the triage vital signs and the nursing notes.  Pertinent labs & imaging results that were available during my care of the patient were reviewed by me and considered in my medical decision making (see chart for details).    Suture removal, laceration of right little finger subsequent encounter.  The laceration on the patient's right little finger appears to be healing well.  Sutures removed by RN.  Education provided on wound care after suture removal.   Instructed him to follow-up with his PCP as needed.  He agrees to plan of care.  Final Clinical Impressions(s) / UC Diagnoses   Final diagnoses:  Visit for suture removal  Laceration of right little finger without foreign body without damage to nail, subsequent encounter     Discharge Instructions      See the attached information on wound care after stitches have been removed.  Follow-up with your primary care provider as needed.      ED Prescriptions   None    PDMP not reviewed this encounter.    [1]  Social History Tobacco Use   Smoking status: Never   Smokeless tobacco: Never  Substance Use Topics   Alcohol use: Never   Drug use: Yes    Types: Marijuana    Comment: 2-3 x per day     Jason Burnard DEL, NP 09/29/24 1042  "

## 2024-10-11 ENCOUNTER — Ambulatory Visit
Admission: RE | Admit: 2024-10-11 | Discharge: 2024-10-11 | Disposition: A | Discharge status: Home / Self Care | Source: Ambulatory Visit | Attending: Family Medicine | Admitting: Family Medicine

## 2024-10-11 VITALS — BP 124/70 | HR 93 | Temp 99.1°F | Resp 18

## 2024-10-11 DIAGNOSIS — J020 Streptococcal pharyngitis: Secondary | ICD-10-CM | POA: Insufficient documentation

## 2024-10-11 DIAGNOSIS — J029 Acute pharyngitis, unspecified: Secondary | ICD-10-CM | POA: Diagnosis not present

## 2024-10-11 DIAGNOSIS — Z113 Encounter for screening for infections with a predominantly sexual mode of transmission: Secondary | ICD-10-CM | POA: Diagnosis not present

## 2024-10-11 LAB — POCT RAPID STREP A (OFFICE): Rapid Strep A Screen: POSITIVE — AB

## 2024-10-11 MED ORDER — AMOXICILLIN 500 MG PO CAPS: 500.0000 mg | ORAL_CAPSULE | Freq: Two times a day (BID) | ORAL | 0 refills | Status: AC

## 2024-10-11 NOTE — ED Provider Notes (Signed)
 " Jason Rodriguez    CSN: [id number] Arrival date & time: 10/11/24  1457      History   Chief Complaint Chief Complaint  Patient presents with   Sore Throat    Difficulty swallowing very painful - Entered by patient    HPI Jason Rodriguez is a 29 y.o. male  presents for evaluation of URI symptoms for 2 days. Patient reports associated symptoms of sore throat and swollen lymph nodes. Denies N/V/D, cough, congestion, fevers, ear pain, body aches or shortness of breath. Patient does not have a hx of asthma. Patient is not an active smoker.   Reports no known sick contacts.  Pt has taken DayQuil NyQuil OTC for symptoms.  Patient would also like STD screening while here.  He denies any symptoms including penile discharge, testicular pain or swelling, rashes.  No known STD exposure.  Pt has no other concerns at this time.    Sore Throat    History reviewed. No pertinent past medical history.  There are no active problems to display for this patient.   History reviewed. No pertinent surgical history.     Home Medications    Prior to Admission medications  Medication Sig Start Date End Date Taking? Authorizing Provider  amoxicillin  (AMOXIL ) 500 MG capsule Take 1 capsule (500 mg total) by mouth 2 (two) times daily for 10 days. 10/11/24 10/21/24 Yes Kellis Topete, Jodi R, NP  benzonatate  (TESSALON ) 100 MG capsule Take 1 capsule (100 mg total) by mouth 3 (three) times daily as needed for cough. Patient not taking: Reported on 09/29/2024 10/17/22   Silver Fell A, PA  methocarbamol  (ROBAXIN ) 500 MG tablet Take 1 tablet (500 mg total) by mouth 2 (two) times daily. Patient not taking: Reported on 09/29/2024 02/23/20   Aberman, Caroline C, PA-C  naproxen  (NAPROSYN ) 500 MG tablet Take 1 tablet (500 mg total) by mouth 2 (two) times daily. Patient not taking: Reported on 09/29/2024 02/23/20   Jason Rodriguez    Family History History reviewed. No pertinent family history.  Social  History Social History[1]   Allergies   Patient has no known allergies.   Review of Systems Review of Systems  HENT:  Positive for sore throat.   Genitourinary:        STD screening     Physical Exam Triage Vital Signs ED Triage Vitals  Encounter Vitals Group     BP 10/11/24 1545 124/70     Girls Systolic BP Percentile --      Girls Diastolic BP Percentile --      Boys Systolic BP Percentile --      Boys Diastolic BP Percentile --      Pulse Rate 10/11/24 1545 93     Resp 10/11/24 1545 18     Temp 10/11/24 1545 99.1 F (37.3 C)     Temp Source 10/11/24 1545 Oral     SpO2 10/11/24 1545 97 %     Weight --      Height --      Head Circumference --      Peak Flow --      Pain Score 10/11/24 1603 7     Pain Loc --      Pain Education --      Exclude from Growth Chart --    No data found.  Updated Vital Signs BP 124/70 (BP Location: Right Arm)   Pulse 93   Temp 99.1 F (37.3 C) (Oral)   Resp 18  SpO2 97%   Visual Acuity Right Eye Distance:   Left Eye Distance:   Bilateral Distance:    Right Eye Near:   Left Eye Near:    Bilateral Near:     Physical Exam Vitals and nursing note reviewed.  Constitutional:      General: He is not in acute distress.    Appearance: Normal appearance. He is not ill-appearing or toxic-appearing.  HENT:     Head: Normocephalic and atraumatic.     Right Ear: Tympanic membrane and ear canal normal.     Left Ear: Tympanic membrane and ear canal normal.     Nose: No congestion or rhinorrhea.     Mouth/Throat:     Mouth: Mucous membranes are moist.     Pharynx: Oropharynx is clear. Posterior oropharyngeal erythema present. No uvula swelling.     Tonsils: Tonsillar exudate present. 2+ on the right. 2+ on the left.  Eyes:     Pupils: Pupils are equal, round, and reactive to light.  Cardiovascular:     Rate and Rhythm: Normal rate and regular rhythm.     Heart sounds: Normal heart sounds.  Pulmonary:     Effort: Pulmonary  effort is normal.     Breath sounds: Normal breath sounds.  Musculoskeletal:     Cervical back: Normal range of motion and neck supple.  Lymphadenopathy:     Cervical: No cervical adenopathy.  Skin:    General: Skin is warm and dry.  Neurological:     General: No focal deficit present.     Mental Status: He is alert and oriented to person, place, and time.  Psychiatric:        Mood and Affect: Mood normal.        Behavior: Behavior normal.      UC Treatments / Results  Labs (all labs ordered are listed, but only abnormal results are displayed) Labs Reviewed  POCT RAPID STREP A (OFFICE) - Abnormal; Notable for the following components:      Result Value   Rapid Strep A Screen Positive (*)    All other components within normal limits  SYPHILIS: RPR W/REFLEX TO RPR TITER AND TREPONEMAL ANTIBODIES, TRADITIONAL SCREENING AND DIAGNOSIS ALGORITHM  HIV ANTIBODY (ROUTINE TESTING W REFLEX)  CYTOLOGY, (ORAL, ANAL, URETHRAL) ANCILLARY ONLY    EKG   Radiology No results found.  Procedures Procedures (including critical care time)  Medications Ordered in UC Medications - No data to display  Initial Impression / Assessment and Plan / UC Course  I have reviewed the triage vital signs and the nursing notes.  Pertinent labs & imaging results that were available during my care of the patient were reviewed by me and considered in my medical decision making (see chart for details).     Positive rapid strep, start amoxicillin .  Discussed OTC analgesics, salt water  gargles and warm liquids.  STD testing as ordered and will contact for any positive results.  PCP follow-up 2 to 3 days for recheck.  ER precautions reviewed. Final Clinical Impressions(s) / UC Diagnoses   Final diagnoses:  Sore throat  Streptococcal sore throat  Screening examination for STD (sexually transmitted disease)     Discharge Instructions      You have tested positive for strep throat.  Start amoxicillin   twice daily for 10 days.  You may do Tylenol  or ibuprofen over-the-counter as needed as well as salt water  gargles and warm liquids such as teas and honey.  The clinical contact you  with results of the STD testing done today if positive.  Lots of rest and fluids.  Please follow-up with your PCP in 2 to 3 days for recheck.  Please go to the ER for any worsening symptoms.  Hope you feel better soon!    ED Prescriptions     Medication Sig Dispense Auth. Provider   amoxicillin  (AMOXIL ) 500 MG capsule Take 1 capsule (500 mg total) by mouth 2 (two) times daily for 10 days. 20 capsule Savva Beamer, Jodi R, NP      PDMP not reviewed this encounter.    [1]  Social History Tobacco Use   Smoking status: Never   Smokeless tobacco: Never  Substance Use Topics   Alcohol use: Never   Drug use: Yes    Types: Marijuana    Comment: 2-3 x per day     Jason Myla SAUNDERS, NP 10/11/24 1624  "

## 2024-10-11 NOTE — Discharge Instructions (Addendum)
 You have tested positive for strep throat.  Start amoxicillin  twice daily for 10 days.  You may do Tylenol  or ibuprofen over-the-counter as needed as well as salt water  gargles and warm liquids such as teas and honey.  The clinical contact you with results of the STD testing done today if positive.  Lots of rest and fluids.  Please follow-up with your PCP in 2 to 3 days for recheck.  Please go to the ER for any worsening symptoms.  Hope you feel better soon!

## 2024-10-11 NOTE — ED Triage Notes (Signed)
 Patient complains of sore throat and swollen lymph nodes. Patient reports it hurts to swallow. Rates pain 7/10.  Patient took Dayquil/Nyquil on yesterday.

## 2024-10-12 LAB — CYTOLOGY, (ORAL, ANAL, URETHRAL) ANCILLARY ONLY
Chlamydia: NEGATIVE
Comment: NEGATIVE
Comment: NEGATIVE
Comment: NORMAL
Neisseria Gonorrhea: NEGATIVE
Trichomonas: NEGATIVE

## 2024-10-13 LAB — SYPHILIS: RPR W/REFLEX TO RPR TITER AND TREPONEMAL ANTIBODIES, TRADITIONAL SCREENING AND DIAGNOSIS ALGORITHM: RPR Ser Ql: NONREACTIVE

## 2024-10-13 LAB — HIV ANTIBODY (ROUTINE TESTING W REFLEX): HIV Screen 4th Generation wRfx: NONREACTIVE
